# Patient Record
Sex: Female | Born: 1985 | Race: Black or African American | Hispanic: No | Marital: Married | Smoking: Never smoker
Health system: Southern US, Community
[De-identification: ages and names within clinical notes are randomized; demographics above are authoritative.]

## PROBLEM LIST (undated history)

## (undated) ENCOUNTER — Inpatient Hospital Stay (HOSPITAL_COMMUNITY): Payer: Self-pay

## (undated) DIAGNOSIS — O139 Gestational [pregnancy-induced] hypertension without significant proteinuria, unspecified trimester: Secondary | ICD-10-CM

## (undated) DIAGNOSIS — R519 Headache, unspecified: Secondary | ICD-10-CM

## (undated) DIAGNOSIS — Z8619 Personal history of other infectious and parasitic diseases: Secondary | ICD-10-CM

## (undated) DIAGNOSIS — R51 Headache: Secondary | ICD-10-CM

## (undated) DIAGNOSIS — R12 Heartburn: Secondary | ICD-10-CM

## (undated) DIAGNOSIS — R002 Palpitations: Secondary | ICD-10-CM

## (undated) DIAGNOSIS — D241 Benign neoplasm of right breast: Secondary | ICD-10-CM

## (undated) HISTORY — DX: Gestational (pregnancy-induced) hypertension without significant proteinuria, unspecified trimester: O13.9

## (undated) HISTORY — DX: Headache: R51

## (undated) HISTORY — DX: Personal history of other infectious and parasitic diseases: Z86.19

## (undated) HISTORY — DX: Headache, unspecified: R51.9

## (undated) HISTORY — DX: Palpitations: R00.2

---

## 2008-01-23 ENCOUNTER — Emergency Department (HOSPITAL_COMMUNITY): Admission: EM | Admit: 2008-01-23 | Discharge: 2008-01-23 | Payer: Self-pay | Admitting: Emergency Medicine

## 2011-07-16 LAB — POCT I-STAT, CHEM 8
Chloride: 104
Glucose, Bld: 117 — ABNORMAL HIGH
HCT: 45
Potassium: 3.6

## 2011-07-16 LAB — CBC
MCHC: 33.8
RBC: 4.86
WBC: 12.8 — ABNORMAL HIGH

## 2011-07-16 LAB — DIFFERENTIAL
Basophils Relative: 0
Lymphocytes Relative: 16
Monocytes Relative: 2 — ABNORMAL LOW
Neutro Abs: 10.4 — ABNORMAL HIGH
Neutrophils Relative %: 82 — ABNORMAL HIGH

## 2011-07-16 LAB — URINALYSIS, ROUTINE W REFLEX MICROSCOPIC
Ketones, ur: 40 — AB
Nitrite: NEGATIVE
Specific Gravity, Urine: 1.014
pH: 7.5

## 2011-07-16 LAB — ETHANOL: Alcohol, Ethyl (B): <5

## 2011-07-16 LAB — LIPASE, BLOOD: Lipase: 24

## 2011-10-22 NOTE — L&D Delivery Note (Addendum)
Delivery Note At 12:08 AM a viable and healthy female was delivered spontaneously, LOA.  APGAR: 8,9; weight 7 lbs 2 oz.   Placenta status: Intact, Spontaneous.  3 vessel cord.  Anesthesia:  Epidural Episiotomy: None Lacerations: None Est. Blood Loss (mL): 400  Mom to postpartum.  Baby to nursery-stable.  Orest Dygert D 08/25/2012, 12:22 AM

## 2012-03-25 LAB — OB RESULTS CONSOLE GC/CHLAMYDIA
Chlamydia: NEGATIVE
Gonorrhea: NEGATIVE

## 2012-03-25 LAB — OB RESULTS CONSOLE RUBELLA ANTIBODY, IGM: Rubella: IMMUNE

## 2012-03-25 LAB — OB RESULTS CONSOLE ANTIBODY SCREEN: Antibody Screen: NEGATIVE

## 2012-05-20 ENCOUNTER — Encounter (HOSPITAL_COMMUNITY): Payer: Self-pay | Admitting: *Deleted

## 2012-05-20 ENCOUNTER — Inpatient Hospital Stay (HOSPITAL_COMMUNITY)
Admission: AD | Admit: 2012-05-20 | Discharge: 2012-05-20 | Disposition: A | Discharge status: Home / Self Care | Payer: Medicaid Other | Source: Ambulatory Visit | Attending: Obstetrics and Gynecology | Admitting: Obstetrics and Gynecology

## 2012-05-20 DIAGNOSIS — Z711 Person with feared health complaint in whom no diagnosis is made: Secondary | ICD-10-CM

## 2012-05-20 DIAGNOSIS — O4410 Placenta previa with hemorrhage, unspecified trimester: Secondary | ICD-10-CM

## 2012-05-20 DIAGNOSIS — O368190 Decreased fetal movements, unspecified trimester, not applicable or unspecified: Secondary | ICD-10-CM | POA: Insufficient documentation

## 2012-05-20 DIAGNOSIS — O4400 Placenta previa specified as without hemorrhage, unspecified trimester: Secondary | ICD-10-CM

## 2012-05-20 NOTE — MAU Provider Note (Signed)
  History     CSN: [id number]  Arrival date and time: 05/20/12 1437   First Provider Initiated Contact with Patient 05/20/12 1536      Chief Complaint  Patient presents with  . Decreased Fetal Movement   HPI This is 26 yo G1P0 at 27.1 weeks with known placenta previa.  She felt a cramp earlier today and was worried about the baby and presented to the hospital.  She denies vaginal bleeding, discharge, decreased fetal activity.  OB History    Grav Para Term Preterm Abortions TAB SAB Ect Mult Living   1               No past medical history on file.  No past surgical history on file.  No family history on file.  History  Substance Use Topics  . Smoking status: Not on file  . Smokeless tobacco: Not on file  . Alcohol Use: Not on file    Allergies:  Allergies  Allergen Reactions  . Codeine Anaphylaxis  . Tylenol (Acetaminophen)     Nose bleed    Prescriptions prior to admission  Medication Sig Dispense Refill  . dextromethorphan-guaiFENesin (MUCINEX DM) 30-600 MG per 12 hr tablet Take 2 tablets by mouth every 12 (twelve) hours. headcold      . guaifenesin (ROBITUSSIN) 100 MG/5ML syrup Take 200 mg by mouth 3 (three) times daily as needed. headcold      . Prenatal Vit-Fe Fumarate-FA (PRENATAL MULTIVITAMIN) TABS Take 1 tablet by mouth daily.        ROS Physical Exam   Blood pressure 125/75, pulse 84, temperature 97.1 F (36.2 C), temperature source Oral, resp. rate 18, height 5' 4.5" (1.638 m), weight 77.293 kg (170 lb 6.4 oz).  Physical Exam  Constitutional: She is oriented to person, place, and time. She appears well-developed and well-nourished.  GI: Soft. She exhibits no distension and no mass. There is no tenderness. There is no rebound and no guarding.  Neurological: She is alert and oriented to person, place, and time.  Skin: Skin is warm and dry.  Psychiatric: She has a normal mood and affect. Her behavior is normal. Judgment and thought content normal.     MAU Course  Procedures NST: baseline 150.  Moderate variability.  No decels.  Appropriate for gestational age.  MDM No evidence of contractions or bleeding.  Assessment and Plan  1.  Placenta previa 2.  Worried well  Patient reassured.  D/c to home.  F/u Monday.   STINSON, JACOB JEHIEL 05/20/2012, 3:38 PM

## 2012-05-20 NOTE — MAU Note (Signed)
Pt presents with cramping x 1 hour earlier today about 1300. Pt with placenta previa and decreased fetal movement today.

## 2012-07-13 ENCOUNTER — Other Ambulatory Visit: Payer: Self-pay | Admitting: Obstetrics and Gynecology

## 2012-07-13 ENCOUNTER — Ambulatory Visit
Admission: RE | Admit: 2012-07-13 | Discharge: 2012-07-13 | Disposition: A | Discharge status: Home / Self Care | Payer: Medicaid Other | Source: Ambulatory Visit | Attending: Obstetrics and Gynecology | Admitting: Obstetrics and Gynecology

## 2012-07-13 DIAGNOSIS — N63 Unspecified lump in unspecified breast: Secondary | ICD-10-CM

## 2012-08-11 ENCOUNTER — Telehealth (HOSPITAL_COMMUNITY): Payer: Self-pay | Admitting: *Deleted

## 2012-08-11 ENCOUNTER — Encounter (HOSPITAL_COMMUNITY): Payer: Self-pay | Admitting: *Deleted

## 2012-08-11 NOTE — Telephone Encounter (Signed)
Preadmission screen  

## 2012-08-24 ENCOUNTER — Encounter (HOSPITAL_COMMUNITY): Payer: Self-pay

## 2012-08-24 ENCOUNTER — Encounter (HOSPITAL_COMMUNITY): Payer: Self-pay | Admitting: Anesthesiology

## 2012-08-24 ENCOUNTER — Inpatient Hospital Stay (HOSPITAL_COMMUNITY)
Admission: RE | Admit: 2012-08-24 | Discharge: 2012-08-26 | DRG: 775 | Disposition: A | Discharge status: Home / Self Care | Payer: 59 | Source: Ambulatory Visit | Attending: Obstetrics & Gynecology | Admitting: Obstetrics & Gynecology

## 2012-08-24 ENCOUNTER — Inpatient Hospital Stay (HOSPITAL_COMMUNITY): Payer: 59 | Admitting: Anesthesiology

## 2012-08-24 DIAGNOSIS — Z22330 Carrier of Group B streptococcus: Secondary | ICD-10-CM

## 2012-08-24 DIAGNOSIS — O480 Post-term pregnancy: Principal | ICD-10-CM | POA: Diagnosis present

## 2012-08-24 DIAGNOSIS — O99892 Other specified diseases and conditions complicating childbirth: Secondary | ICD-10-CM | POA: Diagnosis present

## 2012-08-24 LAB — CBC
Hemoglobin: 12.6 g/dL (ref 12.0–15.0)
MCH: 29.6 pg (ref 26.0–34.0)
MCHC: 34.7 g/dL (ref 30.0–36.0)
RDW: 14 % (ref 11.5–15.5)

## 2012-08-24 LAB — RPR: RPR Ser Ql: NONREACTIVE

## 2012-08-24 MED ORDER — BUTORPHANOL TARTRATE 1 MG/ML IJ SOLN: 1.0000 mg | INTRAMUSCULAR | Status: DC | PRN

## 2012-08-24 MED ORDER — LIDOCAINE HCL (PF) 1 % IJ SOLN
30.0000 mL | INTRAMUSCULAR | Status: DC | PRN
  Filled 2012-08-24: qty 30

## 2012-08-24 MED ORDER — DEXTROSE 5 % IV SOLN
2.5000 10*6.[IU] | INTRAVENOUS | Status: DC
  Administered 2012-08-24 (×2): 2.5 10*6.[IU] via INTRAVENOUS
  Filled 2012-08-24 (×8): qty 2.5

## 2012-08-24 MED ORDER — EPHEDRINE 5 MG/ML INJ
10.0000 mg | INTRAVENOUS | Status: DC | PRN
  Filled 2012-08-24: qty 4

## 2012-08-24 MED ORDER — DIPHENHYDRAMINE HCL 50 MG/ML IJ SOLN: 12.5000 mg | INTRAMUSCULAR | Status: DC | PRN

## 2012-08-24 MED ORDER — PENICILLIN G POTASSIUM 5000000 UNITS IJ SOLR
5.0000 10*6.[IU] | Freq: Once | INTRAVENOUS | Status: AC
  Administered 2012-08-24: 5 10*6.[IU] via INTRAVENOUS
  Filled 2012-08-24: qty 5

## 2012-08-24 MED ORDER — CITRIC ACID-SODIUM CITRATE 334-500 MG/5ML PO SOLN: 30.0000 mL | ORAL | Status: DC | PRN

## 2012-08-24 MED ORDER — ACETAMINOPHEN 325 MG PO TABS: 650.0000 mg | ORAL_TABLET | ORAL | Status: DC | PRN

## 2012-08-24 MED ORDER — ONDANSETRON HCL 4 MG/2ML IJ SOLN: 4.0000 mg | Freq: Four times a day (QID) | INTRAMUSCULAR | Status: DC | PRN

## 2012-08-24 MED ORDER — EPHEDRINE 5 MG/ML INJ: 10.0000 mg | INTRAVENOUS | Status: DC | PRN

## 2012-08-24 MED ORDER — PHENYLEPHRINE 40 MCG/ML (10ML) SYRINGE FOR IV PUSH (FOR BLOOD PRESSURE SUPPORT): 80.0000 ug | PREFILLED_SYRINGE | INTRAVENOUS | Status: DC | PRN

## 2012-08-24 MED ORDER — LACTATED RINGERS IV SOLN: 500.0000 mL | Freq: Once | INTRAVENOUS | Status: DC

## 2012-08-24 MED ORDER — FENTANYL 2.5 MCG/ML BUPIVACAINE 1/10 % EPIDURAL INFUSION (WH - ANES)
14.0000 mL/h | INTRAMUSCULAR | Status: DC
Start: 2012-08-24 — End: 2012-08-25
  Administered 2012-08-24 (×2): 14 mL/h via EPIDURAL
  Filled 2012-08-24 (×2): qty 125

## 2012-08-24 MED ORDER — LIDOCAINE HCL (PF) 1 % IJ SOLN
INTRAMUSCULAR | Status: DC | PRN
  Administered 2012-08-24 (×4): 4 mL

## 2012-08-24 MED ORDER — OXYTOCIN 40 UNITS IN LACTATED RINGERS INFUSION - SIMPLE MED: 62.5000 mL/h | INTRAVENOUS | Status: DC

## 2012-08-24 MED ORDER — LACTATED RINGERS IV SOLN
500.0000 mL | INTRAVENOUS | Status: DC | PRN
  Administered 2012-08-24: 800 mL via INTRAVENOUS

## 2012-08-24 MED ORDER — OXYTOCIN 40 UNITS IN LACTATED RINGERS INFUSION - SIMPLE MED
1.0000 m[IU]/min | INTRAVENOUS | Status: DC
  Administered 2012-08-24: 2 m[IU]/min via INTRAVENOUS
  Filled 2012-08-24: qty 1000

## 2012-08-24 MED ORDER — OXYCODONE-ACETAMINOPHEN 5-325 MG PO TABS: 1.0000 | ORAL_TABLET | ORAL | Status: DC | PRN

## 2012-08-24 MED ORDER — IBUPROFEN 600 MG PO TABS
600.0000 mg | ORAL_TABLET | Freq: Four times a day (QID) | ORAL | Status: DC | PRN
  Filled 2012-08-24: qty 1

## 2012-08-24 MED ORDER — LACTATED RINGERS IV SOLN
INTRAVENOUS | Status: DC
  Administered 2012-08-24 (×2): 1000 mL via INTRAVENOUS

## 2012-08-24 MED ORDER — PHENYLEPHRINE 40 MCG/ML (10ML) SYRINGE FOR IV PUSH (FOR BLOOD PRESSURE SUPPORT)
80.0000 ug | PREFILLED_SYRINGE | INTRAVENOUS | Status: DC | PRN
  Filled 2012-08-24: qty 5

## 2012-08-24 MED ORDER — TERBUTALINE SULFATE 1 MG/ML IJ SOLN: 0.2500 mg | Freq: Once | INTRAMUSCULAR | Status: AC | PRN

## 2012-08-24 MED ORDER — OXYTOCIN BOLUS FROM INFUSION: 500.0000 mL | INTRAVENOUS | Status: DC

## 2012-08-24 NOTE — Anesthesia Procedure Notes (Signed)
Epidural Patient location during procedure: OB Start time: 08/24/2012 2:05 PM  Staffing Performed by: anesthesiologist   Preanesthetic Checklist Completed: patient identified, site marked, surgical consent, pre-op evaluation, timeout performed, IV checked, risks and benefits discussed and monitors and equipment checked  Epidural Patient position: sitting Prep: site prepped and draped and DuraPrep Patient monitoring: continuous pulse ox and blood pressure Approach: midline Injection technique: LOR air  Needle:  Needle type: Tuohy  Needle gauge: 17 G Needle length: 9 cm and 9 Needle insertion depth: 6 cm Catheter type: closed end flexible Catheter size: 19 Gauge Catheter at skin depth: 11 cm Test dose: negative  Assessment Events: blood not aspirated, injection not painful, no injection resistance, negative IV test and no paresthesia  Additional Notes Discussed risk of headache, infection, bleeding, nerve injury and failed or incomplete block.  Patient voices understanding and wishes to proceed. Reason for block:procedure for pain

## 2012-08-24 NOTE — Anesthesia Preprocedure Evaluation (Signed)

## 2012-08-24 NOTE — H&P (Signed)
26 y.o. G1P0  Estimated Date of Delivery: 08/18/12 admitted at 40/[redacted] weeks gestation for induction.  Prenatal Transfer Tool  Maternal Diabetes: No Genetic Screening: Normal Maternal Ultrasounds/Referrals: Normal Fetal Ultrasounds or other Referrals:  None Maternal Substance Abuse:  No Significant Maternal Medications:  None Significant Maternal Lab Results: Lab values include: Group B Strep positive Other Significant Pregnancy Complications:  Post dates.  Afebrile, VSS Heart and Lungs: No active disease Abdomen: soft, gravid, EFW AGA. Cervical exam:  2/50.  Impression: post date pregnancy  Plan:  Induction of labor.

## 2012-08-24 NOTE — Progress Notes (Signed)
MD notified of pt status, SVE, FHR baseline increase and decrease in FHR variability. MD reviewed FHR tracing. Will continue to monitor.

## 2012-08-25 ENCOUNTER — Encounter (HOSPITAL_COMMUNITY): Payer: Self-pay

## 2012-08-25 LAB — CBC
Platelets: 168 10*3/uL (ref 150–400)
RBC: 4.13 MIL/uL (ref 3.87–5.11)
WBC: 19.6 10*3/uL — ABNORMAL HIGH (ref 4.0–10.5)

## 2012-08-25 MED ORDER — WITCH HAZEL-GLYCERIN EX PADS: 1.0000 "application " | MEDICATED_PAD | CUTANEOUS | Status: DC | PRN

## 2012-08-25 MED ORDER — SIMETHICONE 80 MG PO CHEW
80.0000 mg | CHEWABLE_TABLET | ORAL | Status: DC | PRN
  Administered 2012-08-25 (×2): 80 mg via ORAL

## 2012-08-25 MED ORDER — TRAMADOL HCL 50 MG PO TABS
50.0000 mg | ORAL_TABLET | Freq: Four times a day (QID) | ORAL | Status: DC
  Administered 2012-08-25 – 2012-08-26 (×3): 50 mg via ORAL
  Filled 2012-08-25 (×3): qty 1

## 2012-08-25 MED ORDER — ONDANSETRON HCL 4 MG PO TABS: 4.0000 mg | ORAL_TABLET | ORAL | Status: DC | PRN

## 2012-08-25 MED ORDER — TETANUS-DIPHTH-ACELL PERTUSSIS 5-2.5-18.5 LF-MCG/0.5 IM SUSP: 0.5000 mL | Freq: Once | INTRAMUSCULAR | Status: DC

## 2012-08-25 MED ORDER — BENZOCAINE-MENTHOL 20-0.5 % EX AERO
1.0000 "application " | INHALATION_SPRAY | CUTANEOUS | Status: DC | PRN
  Filled 2012-08-25: qty 56

## 2012-08-25 MED ORDER — SENNOSIDES-DOCUSATE SODIUM 8.6-50 MG PO TABS
2.0000 | ORAL_TABLET | Freq: Every day | ORAL | Status: DC
  Administered 2012-08-25: 2 via ORAL

## 2012-08-25 MED ORDER — LANOLIN HYDROUS EX OINT: TOPICAL_OINTMENT | CUTANEOUS | Status: DC | PRN

## 2012-08-25 MED ORDER — PRENATAL MULTIVITAMIN CH
1.0000 | ORAL_TABLET | Freq: Every day | ORAL | Status: DC
  Administered 2012-08-25 – 2012-08-26 (×2): 1 via ORAL
  Filled 2012-08-25 (×2): qty 1

## 2012-08-25 MED ORDER — ONDANSETRON HCL 4 MG/2ML IJ SOLN: 4.0000 mg | INTRAMUSCULAR | Status: DC | PRN

## 2012-08-25 MED ORDER — IBUPROFEN 600 MG PO TABS
600.0000 mg | ORAL_TABLET | Freq: Four times a day (QID) | ORAL | Status: DC
  Administered 2012-08-25 – 2012-08-26 (×6): 600 mg via ORAL
  Filled 2012-08-25 (×6): qty 1

## 2012-08-25 MED ORDER — DIBUCAINE 1 % RE OINT: 1.0000 "application " | TOPICAL_OINTMENT | RECTAL | Status: DC | PRN

## 2012-08-25 MED ORDER — DIPHENHYDRAMINE HCL 25 MG PO CAPS: 25.0000 mg | ORAL_CAPSULE | Freq: Four times a day (QID) | ORAL | Status: DC | PRN

## 2012-08-25 MED ORDER — OXYCODONE-ACETAMINOPHEN 5-325 MG PO TABS: 1.0000 | ORAL_TABLET | ORAL | Status: DC | PRN

## 2012-08-25 MED ORDER — HYDROMORPHONE HCL 2 MG PO TABS: 1.0000 mg | ORAL_TABLET | ORAL | Status: DC | PRN

## 2012-08-25 MED ORDER — ZOLPIDEM TARTRATE 5 MG PO TABS: 5.0000 mg | ORAL_TABLET | Freq: Every evening | ORAL | Status: DC | PRN

## 2012-08-26 MED ORDER — IBUPROFEN 600 MG PO TABS: 600.0000 mg | ORAL_TABLET | Freq: Four times a day (QID) | ORAL | Status: DC

## 2012-08-26 NOTE — Discharge Summary (Signed)
Obstetric Discharge Summary Reason for Admission: induction of labor Prenatal Procedures: none Intrapartum Procedures: spontaneous vaginal delivery Postpartum Procedures: antibiotics Complications-Operative and Postpartum: none Hemoglobin  Date Value Range Status  08/25/2012 11.9* 12.0 - 15.0 g/dL Final     HCT  Date Value Range Status  08/25/2012 35.4* 36.0 - 46.0 % Final    Physical Exam:  General: alert and cooperative Lochia: appropriate Uterine Fundus: firm DVT Evaluation: No evidence of DVT seen on physical exam. Negative Homan's sign.  Discharge Diagnoses: Term Pregnancy-delivered  Discharge Information: Date: 08/26/2012 Activity: pelvic rest Diet: routine Medications: PNV, Ibuprofen and Colace Condition: stable Instructions: refer to practice specific booklet Discharge to: home Follow-up Information    Follow up with Tignall Paone, DO. In 4 weeks.   Contact information:   [address]    [place] [address] [phone]          Newborn Data: Live born female  Birth Weight: 7 lb 1.6 oz (3220 g) APGAR: 7, 9  Home with mother.  Anna Santos 08/26/2012, 11:35 AM

## 2012-08-28 NOTE — Anesthesia Postprocedure Evaluation (Signed)
Patient stable following vaginal delivery.  

## 2014-08-22 ENCOUNTER — Encounter (HOSPITAL_COMMUNITY): Payer: Self-pay

## 2015-10-22 NOTE — L&D Delivery Note (Signed)
Delivery Note At 12:41 AM a viable and healthy female was delivered via Vaginal, Spontaneous Delivery (Presentation: LOA; vtx ).  APGAR: 8, 9; weight pending .   Placenta status: spontaneous, intact not sent, .  Cord:  with the following complications: none.  Cord pH: none  Anesthesia:   Episiotomy: None Lacerations: 2nd perineal degree Suture Repair: 3.0 chromic Est. Blood Loss (mL): 250  Mom to postpartum.  Baby to Couplet care / Skin to Skin.  Darrie Macmillan A 06/23/2016, 1:44 AM

## 2016-03-14 LAB — OB RESULTS CONSOLE ABO/RH: RH TYPE: POSITIVE

## 2016-03-14 LAB — OB RESULTS CONSOLE HEPATITIS B SURFACE ANTIGEN: HEP B S AG: NEGATIVE

## 2016-03-14 LAB — OB RESULTS CONSOLE HIV ANTIBODY (ROUTINE TESTING): HIV: NONREACTIVE

## 2016-03-14 LAB — OB RESULTS CONSOLE GC/CHLAMYDIA
CHLAMYDIA, DNA PROBE: NEGATIVE
Gonorrhea: NEGATIVE

## 2016-03-14 LAB — OB RESULTS CONSOLE RPR: RPR: NONREACTIVE

## 2016-03-14 LAB — OB RESULTS CONSOLE RUBELLA ANTIBODY, IGM: RUBELLA: IMMUNE

## 2016-03-14 LAB — OB RESULTS CONSOLE ANTIBODY SCREEN: ANTIBODY SCREEN: NEGATIVE

## 2016-03-26 ENCOUNTER — Other Ambulatory Visit: Payer: Self-pay | Admitting: Obstetrics and Gynecology

## 2016-03-26 DIAGNOSIS — N63 Unspecified lump in unspecified breast: Secondary | ICD-10-CM

## 2016-04-04 ENCOUNTER — Ambulatory Visit
Admission: RE | Admit: 2016-04-04 | Discharge: 2016-04-04 | Disposition: A | Discharge status: Home / Self Care | Payer: 59 | Source: Ambulatory Visit | Attending: Obstetrics and Gynecology | Admitting: Obstetrics and Gynecology

## 2016-04-04 ENCOUNTER — Other Ambulatory Visit: Payer: Self-pay | Admitting: Obstetrics and Gynecology

## 2016-04-04 DIAGNOSIS — N63 Unspecified lump in unspecified breast: Secondary | ICD-10-CM

## 2016-04-04 DIAGNOSIS — N6310 Unspecified lump in the right breast, unspecified quadrant: Secondary | ICD-10-CM

## 2016-04-09 ENCOUNTER — Ambulatory Visit
Admission: RE | Admit: 2016-04-09 | Discharge: 2016-04-09 | Disposition: A | Discharge status: Home / Self Care | Payer: 59 | Source: Ambulatory Visit | Attending: Obstetrics and Gynecology | Admitting: Obstetrics and Gynecology

## 2016-04-09 DIAGNOSIS — N6310 Unspecified lump in the right breast, unspecified quadrant: Secondary | ICD-10-CM

## 2016-05-20 LAB — OB RESULTS CONSOLE GBS: STREP GROUP B AG: NEGATIVE

## 2016-06-17 ENCOUNTER — Inpatient Hospital Stay (HOSPITAL_COMMUNITY)
Admission: AD | Admit: 2016-06-17 | Discharge: 2016-06-17 | Disposition: A | Discharge status: Home / Self Care | Payer: 59 | Source: Ambulatory Visit | Attending: Obstetrics and Gynecology | Admitting: Obstetrics and Gynecology

## 2016-06-17 ENCOUNTER — Other Ambulatory Visit: Payer: Self-pay | Admitting: Obstetrics and Gynecology

## 2016-06-17 ENCOUNTER — Encounter (HOSPITAL_COMMUNITY): Payer: Self-pay | Admitting: *Deleted

## 2016-06-17 DIAGNOSIS — O133 Gestational [pregnancy-induced] hypertension without significant proteinuria, third trimester: Secondary | ICD-10-CM | POA: Diagnosis present

## 2016-06-17 DIAGNOSIS — Z3A39 39 weeks gestation of pregnancy: Secondary | ICD-10-CM | POA: Insufficient documentation

## 2016-06-17 HISTORY — DX: Benign neoplasm of right breast: D24.1

## 2016-06-17 HISTORY — DX: Heartburn: R12

## 2016-06-17 LAB — COMPREHENSIVE METABOLIC PANEL
ALT: 10 U/L — AB (ref 14–54)
AST: 18 U/L (ref 15–41)
Albumin: 3 g/dL — ABNORMAL LOW (ref 3.5–5.0)
Alkaline Phosphatase: 117 U/L (ref 38–126)
Anion gap: 5 (ref 5–15)
BUN: 10 mg/dL (ref 6–20)
CHLORIDE: 106 mmol/L (ref 101–111)
CO2: 23 mmol/L (ref 22–32)
CREATININE: 0.59 mg/dL (ref 0.44–1.00)
Calcium: 8.9 mg/dL (ref 8.9–10.3)
Glucose, Bld: 78 mg/dL (ref 65–99)
POTASSIUM: 4.2 mmol/L (ref 3.5–5.1)
Sodium: 134 mmol/L — ABNORMAL LOW (ref 135–145)
Total Bilirubin: 0.5 mg/dL (ref 0.3–1.2)
Total Protein: 6.4 g/dL — ABNORMAL LOW (ref 6.5–8.1)

## 2016-06-17 LAB — CBC
HCT: 35.8 % — ABNORMAL LOW (ref 36.0–46.0)
Hemoglobin: 12.1 g/dL (ref 12.0–15.0)
MCH: 27.1 pg (ref 26.0–34.0)
MCHC: 33.8 g/dL (ref 30.0–36.0)
MCV: 80.1 fL (ref 78.0–100.0)
PLATELETS: 208 10*3/uL (ref 150–400)
RBC: 4.47 MIL/uL (ref 3.87–5.11)
RDW: 13.8 % (ref 11.5–15.5)
WBC: 10.7 10*3/uL — ABNORMAL HIGH (ref 4.0–10.5)

## 2016-06-17 LAB — PROTEIN / CREATININE RATIO, URINE
CREATININE, URINE: 142 mg/dL
Protein Creatinine Ratio: 0.09 mg/mg{Cre} (ref 0.00–0.15)
Total Protein, Urine: 13 mg/dL

## 2016-06-17 LAB — URIC ACID: URIC ACID, SERUM: 6.1 mg/dL (ref 2.3–6.6)

## 2016-06-17 NOTE — MAU Provider Note (Signed)
History     Chief Complaint  Patient presents with  . Hypertension  30 yo [id number] BF @ 39 1/[redacted] weeks gestation sent from  The office for evaluation of elevated BP ( 140/90) in the office. Pt had trace protein. Denies h/a, visual changes or epigastric pain  OB History    Gravida Para Term Preterm AB Living   3 1 1   1 1    SAB TAB Ectopic Multiple Live Births   1       1      Past Medical History:  Diagnosis Date  . Fibroadenoma of right breast   . H/O varicella   . Heartburn     History reviewed. No pertinent surgical history.  Family History  Problem Relation Age of Onset  . Cancer Mother     cervical  . Seizures Mother   . Diabetes Father   . Other Other     extra digits    Social History  Substance Use Topics  . Smoking status: Never Smoker  . Smokeless tobacco: Never Used  . Alcohol use No    Allergies:  Allergies  Allergen Reactions  . Codeine Anaphylaxis    No reactions to tramadol, pt mom has the codeine allg  . Banana Itching  . Tylenol [Acetaminophen]     Nose bleed    Prescriptions Prior to Admission  Medication Sig Dispense Refill Last Dose  . Prenatal Vit-Fe Fumarate-FA (PRENATAL MULTIVITAMIN) TABS Take 1 tablet by mouth at bedtime.    06/16/2016 at Unknown time     Physical Exam   Blood pressure 131/81, pulse 84, temperature 98.9 F (37.2 C), temperature source Oral, resp. rate 18, height 5\' 5"  (1.651 m), weight 93.4 kg (206 lb), unknown if currently breastfeeding. No data found.  No exam performed today, exam done in office.  IMP: Gestational HTN Term gestation P) PIH labs, serial BP, NST ED Course   MDM  Rekita Miotke A, MD 12:20 PM 06/17/2016 Addendum:  NST reactive baseline 150 (+) accel to 165 PIH labs nl: protein/creatinine ratio 0.09 hct 35.8 plt 208K  AST 18, ALT 10  Uric acid 6.1 D/c home. OOW. F/u office. Preeclampsia warning signs

## 2016-06-17 NOTE — MAU Note (Signed)
BP elevated in office today

## 2016-06-17 NOTE — Discharge Instructions (Signed)
Keep your scheduled appt for prenatal care. Call the office or provider on call with further concerns.

## 2016-06-18 ENCOUNTER — Telehealth (HOSPITAL_COMMUNITY): Payer: Self-pay | Admitting: *Deleted

## 2016-06-18 ENCOUNTER — Encounter (HOSPITAL_COMMUNITY): Payer: Self-pay | Admitting: *Deleted

## 2016-06-18 NOTE — Telephone Encounter (Signed)
Preadmission screen  

## 2016-06-22 ENCOUNTER — Inpatient Hospital Stay (HOSPITAL_COMMUNITY): Payer: 59 | Admitting: Anesthesiology

## 2016-06-22 ENCOUNTER — Inpatient Hospital Stay (HOSPITAL_COMMUNITY): Admission: RE | Admit: 2016-06-22 | Payer: 59 | Source: Ambulatory Visit

## 2016-06-22 ENCOUNTER — Encounter (HOSPITAL_COMMUNITY): Payer: Self-pay | Admitting: *Deleted

## 2016-06-22 ENCOUNTER — Inpatient Hospital Stay (HOSPITAL_COMMUNITY)
Admission: AD | Admit: 2016-06-22 | Discharge: 2016-06-24 | DRG: 775 | Disposition: A | Discharge status: Home / Self Care | Payer: 59 | Source: Ambulatory Visit | Attending: Obstetrics and Gynecology | Admitting: Obstetrics and Gynecology

## 2016-06-22 DIAGNOSIS — Z3A40 40 weeks gestation of pregnancy: Secondary | ICD-10-CM

## 2016-06-22 DIAGNOSIS — Z8249 Family history of ischemic heart disease and other diseases of the circulatory system: Secondary | ICD-10-CM | POA: Diagnosis not present

## 2016-06-22 DIAGNOSIS — O2243 Hemorrhoids in pregnancy, third trimester: Secondary | ICD-10-CM | POA: Diagnosis present

## 2016-06-22 DIAGNOSIS — Z833 Family history of diabetes mellitus: Secondary | ICD-10-CM

## 2016-06-22 DIAGNOSIS — Z641 Problems related to multiparity: Secondary | ICD-10-CM

## 2016-06-22 DIAGNOSIS — Z3483 Encounter for supervision of other normal pregnancy, third trimester: Secondary | ICD-10-CM | POA: Diagnosis present

## 2016-06-22 LAB — CBC
HCT: 36 % (ref 36.0–46.0)
HCT: 36.6 % (ref 36.0–46.0)
HEMOGLOBIN: 12.3 g/dL (ref 12.0–15.0)
Hemoglobin: 12.2 g/dL (ref 12.0–15.0)
MCH: 27.3 pg (ref 26.0–34.0)
MCH: 27.2 pg (ref 26.0–34.0)
MCHC: 33.9 g/dL (ref 30.0–36.0)
MCHC: 33.6 g/dL (ref 30.0–36.0)
MCV: 80.5 fL (ref 78.0–100.0)
MCV: 80.8 fL (ref 78.0–100.0)
PLATELETS: 210 10*3/uL (ref 150–400)
Platelets: 231 10*3/uL (ref 150–400)
RBC: 4.47 MIL/uL (ref 3.87–5.11)
RBC: 4.53 MIL/uL (ref 3.87–5.11)
RDW: 13.9 % (ref 11.5–15.5)
RDW: 13.9 % (ref 11.5–15.5)
WBC: 10 10*3/uL (ref 4.0–10.5)
WBC: 13.3 10*3/uL — ABNORMAL HIGH (ref 4.0–10.5)

## 2016-06-22 LAB — TYPE AND SCREEN
ABO/RH(D): A POS
Antibody Screen: NEGATIVE

## 2016-06-22 LAB — RPR: RPR: NONREACTIVE

## 2016-06-22 MED ORDER — EPHEDRINE 5 MG/ML INJ
10.0000 mg | INTRAVENOUS | Status: DC | PRN
  Filled 2016-06-22: qty 4

## 2016-06-22 MED ORDER — OXYTOCIN 40 UNITS IN LACTATED RINGERS INFUSION - SIMPLE MED
2.5000 [IU]/h | INTRAVENOUS | Status: DC
  Filled 2016-06-22: qty 1000

## 2016-06-22 MED ORDER — BUTORPHANOL TARTRATE 2 MG/ML IJ SOLN
2.0000 mg | INTRAMUSCULAR | Status: DC | PRN
  Administered 2016-06-22: 2 mg via INTRAVENOUS
  Filled 2016-06-22: qty 2

## 2016-06-22 MED ORDER — LIDOCAINE HCL (PF) 1 % IJ SOLN
30.0000 mL | INTRAMUSCULAR | Status: DC | PRN
  Administered 2016-06-23: 30 mL via SUBCUTANEOUS
  Filled 2016-06-22: qty 30

## 2016-06-22 MED ORDER — MISOPROSTOL 200 MCG PO TABS: 50.0000 ug | ORAL_TABLET | ORAL | Status: DC | PRN

## 2016-06-22 MED ORDER — LACTATED RINGERS IV SOLN: 500.0000 mL | INTRAVENOUS | Status: DC | PRN

## 2016-06-22 MED ORDER — OXYTOCIN BOLUS FROM INFUSION: 500.0000 mL | Freq: Once | INTRAVENOUS | Status: DC

## 2016-06-22 MED ORDER — PHENYLEPHRINE 40 MCG/ML (10ML) SYRINGE FOR IV PUSH (FOR BLOOD PRESSURE SUPPORT)
80.0000 ug | PREFILLED_SYRINGE | INTRAVENOUS | Status: DC | PRN
  Filled 2016-06-22: qty 10
  Filled 2016-06-22: qty 5

## 2016-06-22 MED ORDER — LACTATED RINGERS IV SOLN
INTRAVENOUS | Status: DC
  Administered 2016-06-22: 125 mL/h via INTRAVENOUS
  Administered 2016-06-22: 21:00:00 via INTRAVENOUS

## 2016-06-22 MED ORDER — LACTATED RINGERS IV SOLN: 500.0000 mL | Freq: Once | INTRAVENOUS | Status: DC

## 2016-06-22 MED ORDER — LIDOCAINE HCL (PF) 1 % IJ SOLN
INTRAMUSCULAR | Status: DC | PRN
  Administered 2016-06-22 (×2): 6 mL

## 2016-06-22 MED ORDER — MISOPROSTOL 25 MCG QUARTER TABLET
25.0000 ug | ORAL_TABLET | ORAL | Status: DC
  Administered 2016-06-22 (×2): 25 ug via VAGINAL
  Filled 2016-06-22 (×2): qty 1
  Filled 2016-06-22 (×2): qty 0.25
  Filled 2016-06-22: qty 1

## 2016-06-22 MED ORDER — FENTANYL 2.5 MCG/ML BUPIVACAINE 1/10 % EPIDURAL INFUSION (WH - ANES)
14.0000 mL/h | INTRAMUSCULAR | Status: DC | PRN
  Administered 2016-06-22: 14 mL/h via EPIDURAL
  Filled 2016-06-22: qty 125

## 2016-06-22 MED ORDER — DIPHENHYDRAMINE HCL 50 MG/ML IJ SOLN: 12.5000 mg | INTRAMUSCULAR | Status: DC | PRN

## 2016-06-22 MED ORDER — PHENYLEPHRINE 40 MCG/ML (10ML) SYRINGE FOR IV PUSH (FOR BLOOD PRESSURE SUPPORT)
80.0000 ug | PREFILLED_SYRINGE | INTRAVENOUS | Status: DC | PRN
  Filled 2016-06-22: qty 5
  Filled 2016-06-22: qty 10

## 2016-06-22 MED ORDER — TERBUTALINE SULFATE 1 MG/ML IJ SOLN
0.2500 mg | Freq: Once | INTRAMUSCULAR | Status: DC | PRN
  Filled 2016-06-22: qty 1

## 2016-06-22 MED ORDER — ONDANSETRON HCL 4 MG/2ML IJ SOLN: 4.0000 mg | Freq: Four times a day (QID) | INTRAMUSCULAR | Status: DC | PRN

## 2016-06-22 MED ORDER — SOD CITRATE-CITRIC ACID 500-334 MG/5ML PO SOLN: 30.0000 mL | ORAL | Status: DC | PRN

## 2016-06-22 MED ORDER — MISOPROSTOL 25 MCG QUARTER TABLET
25.0000 ug | ORAL_TABLET | ORAL | Status: DC | PRN
  Administered 2016-06-22 (×2): 25 ug via ORAL
  Filled 2016-06-22 (×2): qty 0.25

## 2016-06-22 MED ORDER — OXYTOCIN 10 UNIT/ML IJ SOLN: 10.0000 [IU] | Freq: Once | INTRAMUSCULAR | Status: DC

## 2016-06-22 MED ORDER — ZOLPIDEM TARTRATE 5 MG PO TABS: 5.0000 mg | ORAL_TABLET | Freq: Every evening | ORAL | Status: DC | PRN

## 2016-06-22 NOTE — H&P (Signed)
Anna Santos is a 30 y.o. female presenting for IOL at term due to multiparity. PNC notable for large right breast mass biopsy proven fibroadenoma OB History    Gravida Para Term Preterm AB Living   3 1 1   1 1    SAB TAB Ectopic Multiple Live Births   1       1     Past Medical History:  Diagnosis Date  . Fibroadenoma of right breast   . H/O varicella   . Headache   . Heartburn   . Palpitations   . Pregnancy induced hypertension    History reviewed. No pertinent surgical history. Family History: family history includes Cancer in her mother; Diabetes in her father and paternal grandmother; Heart attack in her father; Hypertension in her father; Migraines in her mother; Other in her other; Seizures in her mother. Social History:  reports that she has never smoked. She has never used smokeless tobacco. She reports that she does not drink alcohol or use drugs.     Maternal Diabetes: No Genetic Screening: Normal Maternal Ultrasounds/Referrals: Normal Fetal Ultrasounds or other Referrals:  None Maternal Substance Abuse:  No Significant Maternal Medications:  None Significant Maternal Lab Results:  Lab values include: Group B Strep negative Other Comments:  large ight breast mass bx proven fibroadenoma  Review of Systems  All other systems reviewed and are negative.  History Dilation: 1 Effacement (%): Thick Station: -3 Exam by:: s grindstaff rn Blood pressure 136/88, pulse 80, temperature 97.5 F (36.4 C), temperature source Oral, resp. rate 20, height 5\' 5"  (1.651 m), weight 93.9 kg (207 lb), unknown if currently breastfeeding. Exam Physical Exam  Constitutional: She is oriented to person, place, and time. She appears well-developed and well-nourished.  Neck: Neck supple.  Cardiovascular: Normal rate and regular rhythm.   Respiratory: Breath sounds normal.  GI: Soft.  Musculoskeletal: She exhibits edema.  Neurological: She is alert and oriented to person, place, and  time.  Skin: Skin is warm and dry.    Prenatal labs: ABO, Rh: A/Positive/-- (05/25 0000) Antibody: Negative (05/25 0000) Rubella: Immune (05/25 0000) RPR: Nonreactive (05/25 0000)  HBsAg: Negative (05/25 0000)  HIV: Non-reactive (05/25 0000)  GBS: Negative (07/31 0000)   Assessment/Plan: Term gestation Right breast mass P) admit routine labs. Epidural prn. Oral cytotec. Pitocin prn   Harlei Lehrmann A 06/22/2016, 9:15 AM

## 2016-06-22 NOTE — Anesthesia Preprocedure Evaluation (Addendum)
Anesthesia Evaluation  Patient identified by MRN, date of birth, ID band Patient awake    Reviewed: Allergy & Precautions, NPO status , Patient's Chart, lab work & pertinent test results  Airway Mallampati: II  TM Distance: >3 FB Neck ROM: Full    Dental no notable dental hx.    Pulmonary neg pulmonary ROS,    Pulmonary exam normal breath sounds clear to auscultation       Cardiovascular hypertension, Normal cardiovascular exam Rhythm:Regular Rate:Normal     Neuro/Psych  Headaches, negative psych ROS   GI/Hepatic negative GI ROS, Neg liver ROS,   Endo/Other  negative endocrine ROS  Renal/GU negative Renal ROS  negative genitourinary   Musculoskeletal negative musculoskeletal ROS (+)   Abdominal   Peds negative pediatric ROS (+)  Hematology negative hematology ROS (+)   Anesthesia Other Findings   Reproductive/Obstetrics negative OB ROS                            Anesthesia Physical Anesthesia Plan  ASA: II  Anesthesia Plan: Epidural   Post-op Pain Management:    Induction: Intravenous  Airway Management Planned: Natural Airway  Additional Equipment:   Intra-op Plan:   Post-operative Plan:   Informed Consent: I have reviewed the patients History and Physical, chart, labs and discussed the procedure including the risks, benefits and alternatives for the proposed anesthesia with the patient or authorized representative who has indicated his/her understanding and acceptance.   Dental advisory given  Plan Discussed with: CRNA  Anesthesia Plan Comments: (Informed consent obtained prior to proceeding including risk of failure, 1% risk of PDPH, risk of minor discomfort and bruising.  Discussed rare but serious complications including epidural abscess, permanent nerve injury, epidural hematoma.  Discussed alternatives to epidural analgesia and patient desires to proceed.  Timeout  performed pre-procedure verifying patient name, procedure, and platelet count.  Patient tolerated procedure well.)        Anesthesia Quick Evaluation

## 2016-06-22 NOTE — Anesthesia Pain Management Evaluation Note (Signed)
  CRNA Pain Management Visit Note  Patient: Anna Santos, 30 y.o., female  "Hello I am a member of the anesthesia team at [facility]. We have an anesthesia team available at all times to provide care throughout the hospital, including epidural management and anesthesia for C-section. I don't know your plan for the delivery whether it a natural birth, water birth, IV sedation, nitrous supplementation, doula or epidural, but we want to meet your pain goals."   1.Was your pain managed to your expectations on prior hospitalizations?     2.What is your expectation for pain management during this hospitalization?    3.How can we help you reach that goal?   Record the patient's initial score and the patient's pain goal.   Pain: 0  Pain Goal: 5 The [facility] wants you to be able to say your pain was always managed very well.  Anna Santos 06/22/2016

## 2016-06-22 NOTE — Progress Notes (Signed)
S; notes some cramping S/p oral cytotec x 2  O; VE deferred Tracing: cat 1 baseline140-145  IMP: multiparity P)change to cytotec per vagina next dose`. Pt advised

## 2016-06-22 NOTE — Anesthesia Procedure Notes (Signed)
Epidural Patient location during procedure: OB  Staffing Anesthesiologist: Jannell Franta  Preanesthetic Checklist Completed: patient identified, site marked, surgical consent, pre-op evaluation, timeout performed, IV checked, risks and benefits discussed and monitors and equipment checked  Epidural Patient position: sitting Prep: DuraPrep Patient monitoring: blood pressure and heart rate Approach: midline Location: L4-L5 Injection technique: LOR saline  Needle:  Needle type: Tuohy  Needle gauge: 17 G Needle length: 9 cm Needle insertion depth: 6 cm Catheter type: closed end flexible Catheter size: 19 Gauge Catheter at skin depth: 13 cm Test dose: negative and Other  Assessment Events: blood not aspirated, injection not painful, no injection resistance, negative IV test and no paresthesia  Additional Notes Reason for block:procedure for pain   

## 2016-06-23 ENCOUNTER — Encounter (HOSPITAL_COMMUNITY): Payer: Self-pay | Admitting: Certified Nurse Midwife

## 2016-06-23 MED ORDER — COCONUT OIL OIL
1.0000 | TOPICAL_OIL | Status: DC | PRN
Start: 2016-06-23 — End: 2016-06-24
  Administered 2016-06-23: 1 via TOPICAL
  Filled 2016-06-23: qty 120

## 2016-06-23 MED ORDER — SIMETHICONE 80 MG PO CHEW: 80.0000 mg | CHEWABLE_TABLET | ORAL | Status: DC | PRN

## 2016-06-23 MED ORDER — ONDANSETRON HCL 4 MG PO TABS: 4.0000 mg | ORAL_TABLET | ORAL | Status: DC | PRN

## 2016-06-23 MED ORDER — BENZOCAINE-MENTHOL 20-0.5 % EX AERO
1.0000 "application " | INHALATION_SPRAY | CUTANEOUS | Status: DC | PRN
  Administered 2016-06-23: 1 via TOPICAL
  Filled 2016-06-23: qty 56

## 2016-06-23 MED ORDER — IBUPROFEN 600 MG PO TABS
600.0000 mg | ORAL_TABLET | Freq: Four times a day (QID) | ORAL | Status: DC
  Administered 2016-06-23 – 2016-06-24 (×5): 600 mg via ORAL
  Filled 2016-06-23 (×5): qty 1

## 2016-06-23 MED ORDER — DIBUCAINE 1 % RE OINT: 1.0000 "application " | TOPICAL_OINTMENT | RECTAL | Status: DC | PRN

## 2016-06-23 MED ORDER — OXYCODONE HCL 5 MG PO TABS: 10.0000 mg | ORAL_TABLET | ORAL | Status: DC | PRN

## 2016-06-23 MED ORDER — WITCH HAZEL-GLYCERIN EX PADS: 1.0000 "application " | MEDICATED_PAD | CUTANEOUS | Status: DC | PRN

## 2016-06-23 MED ORDER — PRENATAL MULTIVITAMIN CH
1.0000 | ORAL_TABLET | Freq: Every day | ORAL | Status: DC
  Administered 2016-06-23: 1 via ORAL
  Filled 2016-06-23: qty 1

## 2016-06-23 MED ORDER — DIPHENHYDRAMINE HCL 25 MG PO CAPS: 25.0000 mg | ORAL_CAPSULE | Freq: Four times a day (QID) | ORAL | Status: DC | PRN

## 2016-06-23 MED ORDER — OXYCODONE HCL 5 MG PO TABS
5.0000 mg | ORAL_TABLET | ORAL | Status: DC | PRN
  Administered 2016-06-23 – 2016-06-24 (×3): 5 mg via ORAL
  Filled 2016-06-23 (×3): qty 1

## 2016-06-23 MED ORDER — FERROUS SULFATE 325 (65 FE) MG PO TABS
325.0000 mg | ORAL_TABLET | Freq: Two times a day (BID) | ORAL | Status: DC
  Administered 2016-06-23 (×2): 325 mg via ORAL
  Filled 2016-06-23 (×3): qty 1

## 2016-06-23 MED ORDER — ONDANSETRON HCL 4 MG/2ML IJ SOLN: 4.0000 mg | INTRAMUSCULAR | Status: DC | PRN

## 2016-06-23 MED ORDER — ZOLPIDEM TARTRATE 5 MG PO TABS: 5.0000 mg | ORAL_TABLET | Freq: Every evening | ORAL | Status: DC | PRN

## 2016-06-23 MED ORDER — SENNOSIDES-DOCUSATE SODIUM 8.6-50 MG PO TABS
2.0000 | ORAL_TABLET | ORAL | Status: DC
  Administered 2016-06-23: 2 via ORAL
  Filled 2016-06-23: qty 2

## 2016-06-23 NOTE — Progress Notes (Signed)
Patient admitted to [facility] from [facility] via wheelchair accompanied by RN and family. Able to ambulate from wheelchair to bed with a stesdy gait. Vitals WNL, Color is pink, LCTA Bilat, Fundus is firm and at the umbilicus with small rubra lochia. Pain scale is 0-1. Oriented to room, call light , visiting hours, and paper work. Condition  stable.

## 2016-06-23 NOTE — Lactation Note (Addendum)
This note was copied from a baby's chart. Lactation Consultation Note Initial visit at 62 hours of age.  Rn reports mom need much assistance to latch baby.  Baby is latched now on right breast in football hold.  Mom has large breasts with evert nipples and "dense" feeling breasts with tissue semi compressible.  Baby is having difficulty maintaining deep latch.  FOB instructed to help with chin tug and breast compression.  Mom is not assertive with latching baby and needs much encouragement.  Baby on and off for a while with a few audible swallows.  Baby will not maintain feeding rhythmically with intermittent sucks.  LC encouraged mom to hand express and offer drops of colostrum by spoon after feedings if baby continues to show feeding cues. LC assisted with demonstration of spoon feeding with about 68ml given and baby extends tongue well to lick off the spoon. [facility] LC resources given and discussed.  Encouraged to feed with early cues on demand.  Early newborn behavior discussed.  Hand expression demonstrated by mom with colostrum visible.  Mom to call for assist as needed.   Mom does report some initial latch on pain improved with FOB compressing breast with mom as she breasts are very large.    Patient Name: Anna Santos [id number] Date: 06/23/2016 Reason for consult: Initial assessment   Maternal Data Has patient been taught Hand Expression?: Yes Does the patient have breastfeeding experience prior to this delivery?: No  Feeding Feeding Type: Breast Fed Length of feed: 10 min  LATCH Score/Interventions Latch: Repeated attempts needed to sustain latch, nipple held in mouth throughout feeding, stimulation needed to elicit sucking reflex. Intervention(s): Adjust position;Assist with latch;Breast massage;Breast compression  Audible Swallowing: A few with stimulation Intervention(s): Skin to skin;Hand expression;Alternate breast massage  Type of Nipple: Everted at rest and after stimulation (semi  compressible)  Comfort (Breast/Nipple): Filling, red/small blisters or bruises, mild/mod discomfort  Problem noted: Mild/Moderate discomfort Interventions (Mild/moderate discomfort): Hand expression  Hold (Positioning): Assistance needed to correctly position infant at breast and maintain latch. Intervention(s): Breastfeeding basics reviewed;Position options;Support Pillows;Skin to skin  LATCH Score: 6  Lactation Tools Discussed/Used     Consult Status Consult Status: Follow-up Date: 06/24/16 Follow-up type: In-patient    Anna Santos, Anna Santos 06/23/2016, 7:22 PM

## 2016-06-23 NOTE — Progress Notes (Signed)
Patient assisted out of bed with steady gait to bathroom to void large amount in toilet. Instructed in peri care then ambulated back to bed with steady gait. Condition stable.

## 2016-06-23 NOTE — Progress Notes (Signed)
Anna Santos is a 30 y.o. G3P1011 at [redacted]w[redacted]d by LMP admitted for IOL 2nd to multiparity  Subjective: Pt  C/o rectal pressure SROM @ 11:40 Pm clear fluid Epidural  Objective: BP (!) 153/98   Pulse (!) 106   Temp 98.5 F (36.9 C) (Oral)   Resp 20   Ht 5\' 5"  (1.651 m)   Wt 93.9 kg (207 lb)   SpO2 99%   BMI 34.45 kg/m  No intake/output data recorded. Total I/O In: -  Out: 250 [Blood:250]  FHT:  FHR: 150 bpm, variability: minimal ,  accelerations:  Present,  decelerations:  Absent UC:   Ctx q 2-3 mins SVE:   10 cm dilated, 100% effaced, (+)2 station   Labs: Lab Results  Component Value Date   WBC 13.3 (H) 06/22/2016   HGB 12.3 06/22/2016   HCT 36.6 06/22/2016   MCV 80.8 06/22/2016   PLT 231 06/22/2016    Assessment / Plan: multiparity progressing rapidly after 2nd vaginal cytotec Term gestation P) start pushing  Anticipated MOD:  NSVD  Anna Santos A 06/23/2016, 1:32 AM

## 2016-06-23 NOTE — Anesthesia Postprocedure Evaluation (Signed)
Anesthesia Post Note  Patient: Anna Santos  Procedure(s) Performed: * No procedures listed *  Patient location during evaluation: Mother Baby Anesthesia Type: Epidural Level of consciousness: awake and alert Pain management: pain level controlled Vital Signs Assessment: post-procedure vital signs reviewed and stable Respiratory status: spontaneous breathing, nonlabored ventilation and respiratory function stable Cardiovascular status: stable Postop Assessment: no headache, no backache, epidural receding and patient able to bend at knees Anesthetic complications: no     Last Vitals:  Vitals:   06/23/16 0248 06/23/16 0350  BP: (!) 142/83 128/73  Pulse: 84 95  Resp: 18 20  Temp:  37.3 C    Last Pain:  Vitals:   06/23/16 0624  TempSrc:   PainSc: Asleep   Pain Goal: Patients Stated Pain Goal: 2 (06/23/16 0350)               Rayvon Char

## 2016-06-24 LAB — CBC
HCT: 37 % (ref 36.0–46.0)
Hemoglobin: 12.3 g/dL (ref 12.0–15.0)
MCH: 27 pg (ref 26.0–34.0)
MCHC: 33.2 g/dL (ref 30.0–36.0)
MCV: 81.1 fL (ref 78.0–100.0)
PLATELETS: 210 10*3/uL (ref 150–400)
RBC: 4.56 MIL/uL (ref 3.87–5.11)
RDW: 14.2 % (ref 11.5–15.5)
WBC: 12.5 10*3/uL — ABNORMAL HIGH (ref 4.0–10.5)

## 2016-06-24 MED ORDER — MAGNESIUM OXIDE -MG SUPPLEMENT 200 MG PO TABS: 1.0000 | ORAL_TABLET | Freq: Every day | ORAL | 4 refills | Status: AC

## 2016-06-24 MED ORDER — IBUPROFEN 800 MG PO TABS: 800.0000 mg | ORAL_TABLET | Freq: Four times a day (QID) | ORAL | 0 refills | Status: AC | PRN

## 2016-06-24 MED ORDER — TRAMADOL HCL 50 MG PO TABS: 50.0000 mg | ORAL_TABLET | Freq: Four times a day (QID) | ORAL | 0 refills | Status: AC | PRN

## 2016-06-24 MED ORDER — HYDROCORTISONE 2.5 % RE CREA: 1.0000 "application " | TOPICAL_CREAM | Freq: Two times a day (BID) | RECTAL | 2 refills | Status: AC

## 2016-06-24 NOTE — Discharge Summary (Signed)
Obstetric Discharge Summary Reason for Admission: induction of labor Prenatal Procedures: none Intrapartum Procedures: spontaneous vaginal delivery Postpartum Procedures: none Complications-Operative and Postpartum: 2nd degree perineal laceration Hemoglobin  Date Value Ref Range Status  06/24/2016 12.3 12.0 - 15.0 g/dL Final   HCT  Date Value Ref Range Status  06/24/2016 37.0 36.0 - 46.0 % Final    Physical Exam:  General: alert, cooperative and no distress Lochia: appropriate Uterine Fundus: firm Incision: healing well DVT Evaluation: No evidence of DVT seen on physical exam.  Discharge Diagnoses: Term Pregnancy-delivered  Discharge Information: Date: 06/24/2016 Activity: pelvic rest Diet: routine Medications: PNV, Ibuprofen and proctocream HC 2.5% / magnesium 200-400mg  daily for constipation prevention / tramadol #15 for pain Condition: stable Instructions: refer to practice specific booklet Discharge to: home Follow-up Information    COUSINS,SHERONETTE A, MD. Schedule an appointment as soon as possible for a visit in 6 week(s).   Specialty:  Obstetrics and Gynecology Contact information: [address] [place] [address] [phone]           Newborn Data: Live born female  Birth Weight: 7 lb 14.3 oz (3580 g) APGAR: 8, 9  Home with mother.  Anna Santos 06/24/2016, 9:39 AM

## 2016-06-24 NOTE — Progress Notes (Signed)
PPD 1 SVD w/ 2nd degree repair  S:  Reports feeling cramps - internal rectal pain - (hx internal hemorrhoids) - requests change in pain med & Rx cream for internal hemorrhoids             Wants DC today             Tolerating po/ No nausea or vomiting             Bleeding is light             Pain minimally controlled withmotrin but oxycodone just makes her sleepy             Up ad lib / ambulatory / voiding QS  Newborn breast feeding   O:  VS: BP 125/78   Pulse 73   Temp 97.7 F (36.5 C)   Resp 18   Ht 5\' 5"  (1.651 m)   Wt 93.9 kg (207 lb)   SpO2 100%   Breastfeeding? Unknown   BMI 34.45 kg/m   LABS:              Recent Labs  06/22/16 1942 06/24/16 0628  WBC 13.3* 12.5*  HGB 12.3 12.3  PLT 231 210               Blood type: --/--/A POS (09/02 [id number])  Rubella: Immune (05/25 0000)                      Physical Exam:             Alert and oriented X3  Abdomen: soft, non-tender, non-distended              Fundus: firm, non-tender, U-1  Perineum: mild edema  Lochia: light  Extremities: no edema, no calf pain or tenderness    A: PPD # 1   Doing well - stable status  P: Routine post partum orders - change oxycodone to Tramadol / add Procto-cream for internal hemorrhoids and magnesium daily for constipation prevention  DC home  Artelia Laroche CNM, MSN, [place] 06/24/2016, 9:08 AM

## 2016-06-24 NOTE — Lactation Note (Signed)
This note was copied from a baby's chart. Lactation Consultation Note  Patient Name: Anna Santos [id number] Date: 06/24/2016 Reason for consult: Follow-up assessment  Parents have no questions or concerns at this time.  Mom reports having nursed/lactated for 1 year w/her 1st child.   Mom has our phone # for post-d/c questions. Matthias Hughs [facility] 06/24/2016, 10:40 AM

## 2018-09-29 ENCOUNTER — Other Ambulatory Visit: Payer: Self-pay | Admitting: Obstetrics and Gynecology

## 2018-10-30 ENCOUNTER — Other Ambulatory Visit: Payer: Self-pay | Admitting: General Surgery

## 2018-11-02 ENCOUNTER — Other Ambulatory Visit: Payer: Self-pay | Admitting: General Surgery

## 2018-11-02 DIAGNOSIS — D241 Benign neoplasm of right breast: Secondary | ICD-10-CM

## 2018-11-05 ENCOUNTER — Ambulatory Visit
Admission: RE | Admit: 2018-11-05 | Discharge: 2018-11-05 | Disposition: A | Discharge status: Home / Self Care | Payer: 59 | Source: Ambulatory Visit | Attending: General Surgery | Admitting: General Surgery

## 2018-11-05 ENCOUNTER — Ambulatory Visit
Admission: RE | Admit: 2018-11-05 | Discharge: 2018-11-05 | Disposition: A | Discharge status: Home / Self Care | Payer: BC Managed Care – PPO | Source: Ambulatory Visit | Attending: General Surgery | Admitting: General Surgery

## 2018-11-05 ENCOUNTER — Other Ambulatory Visit: Payer: 59

## 2018-11-05 DIAGNOSIS — D241 Benign neoplasm of right breast: Secondary | ICD-10-CM

## 2019-11-29 ENCOUNTER — Ambulatory Visit
Admission: RE | Admit: 2019-11-29 | Discharge: 2019-11-29 | Disposition: A | Discharge status: Home / Self Care | Payer: BC Managed Care – PPO | Source: Ambulatory Visit | Attending: Internal Medicine | Admitting: Internal Medicine

## 2019-11-29 ENCOUNTER — Other Ambulatory Visit: Payer: Self-pay | Admitting: Internal Medicine

## 2019-11-29 DIAGNOSIS — M25471 Effusion, right ankle: Secondary | ICD-10-CM

## 2020-11-22 IMAGING — CR DG ANKLE COMPLETE 3+V*R*
3 series · 3 of 3 positions shown · non-contrast
Comparison: None.

CLINICAL DATA: Right ankle swelling. Twisted ankle 1 day ago, pain
and swelling laterally.

EXAM:
RIGHT ANKLE - COMPLETE 3+ VIEW

[t ankle joint ap right]
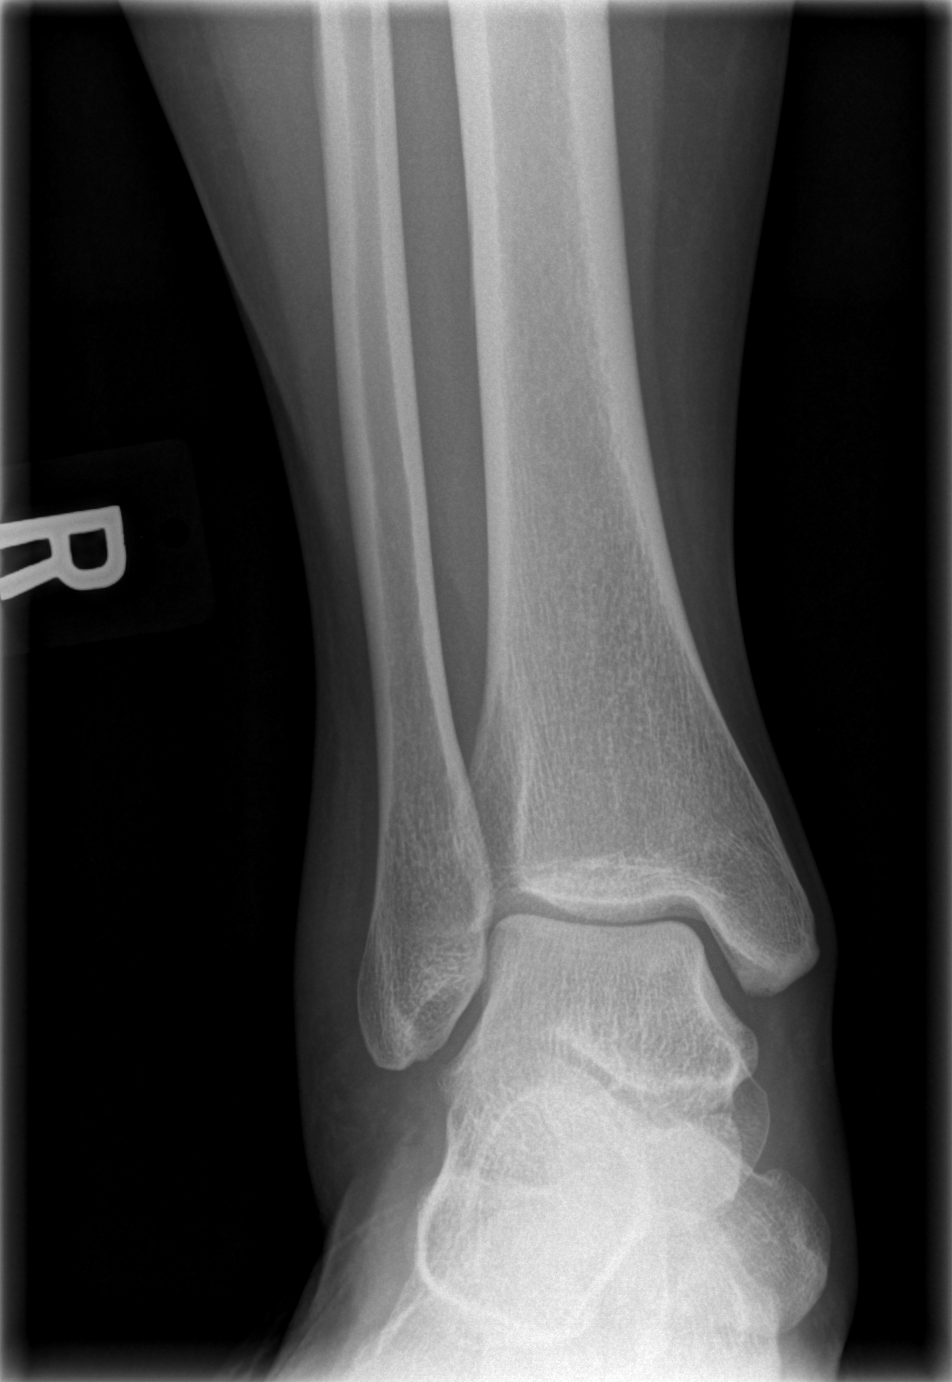

[t ankle joint oblique right]
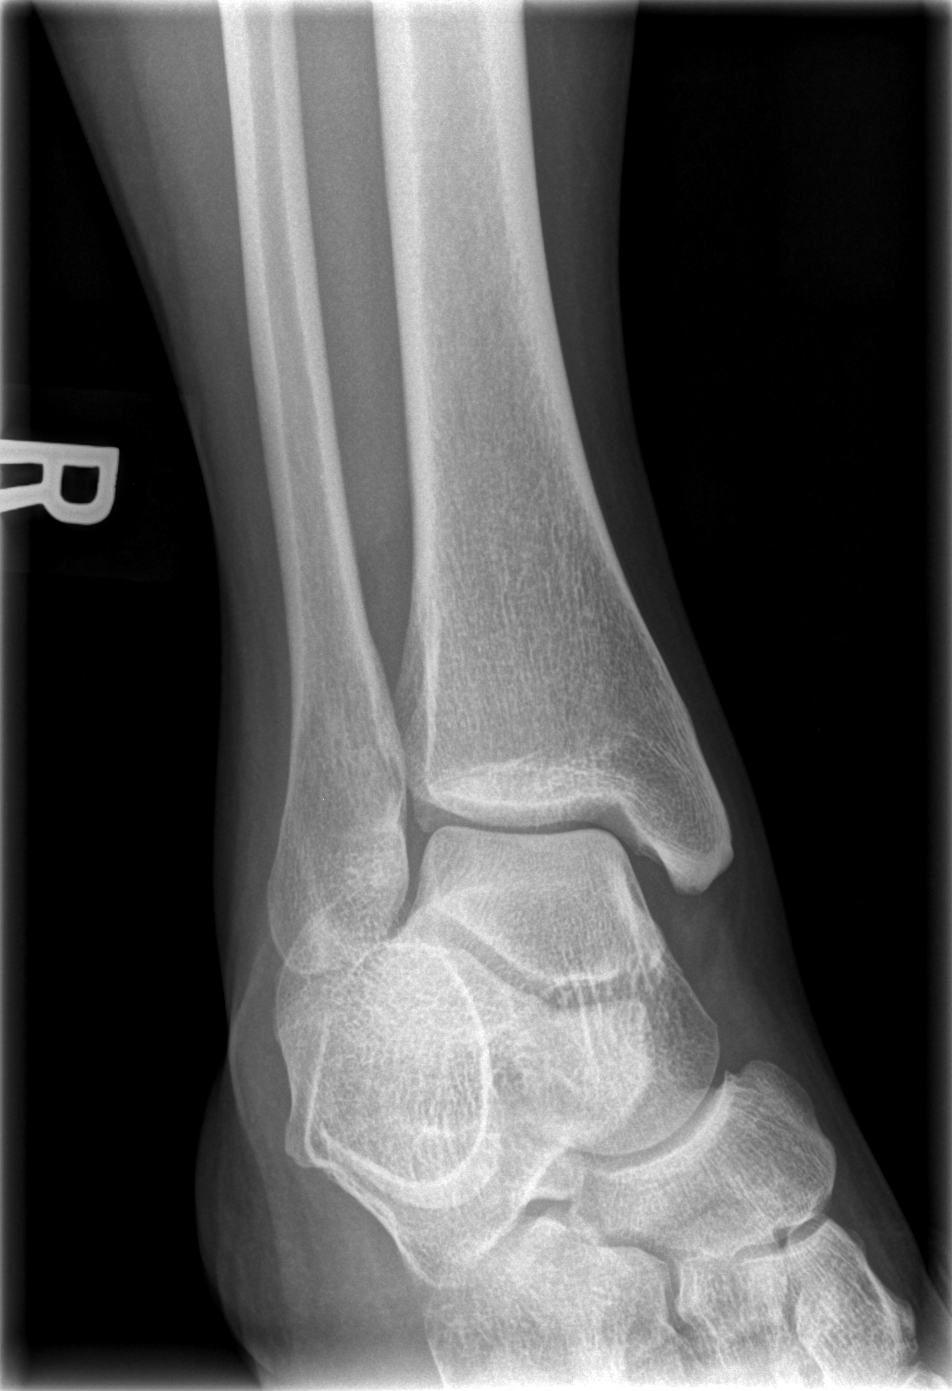

[t ankle joint lat right]
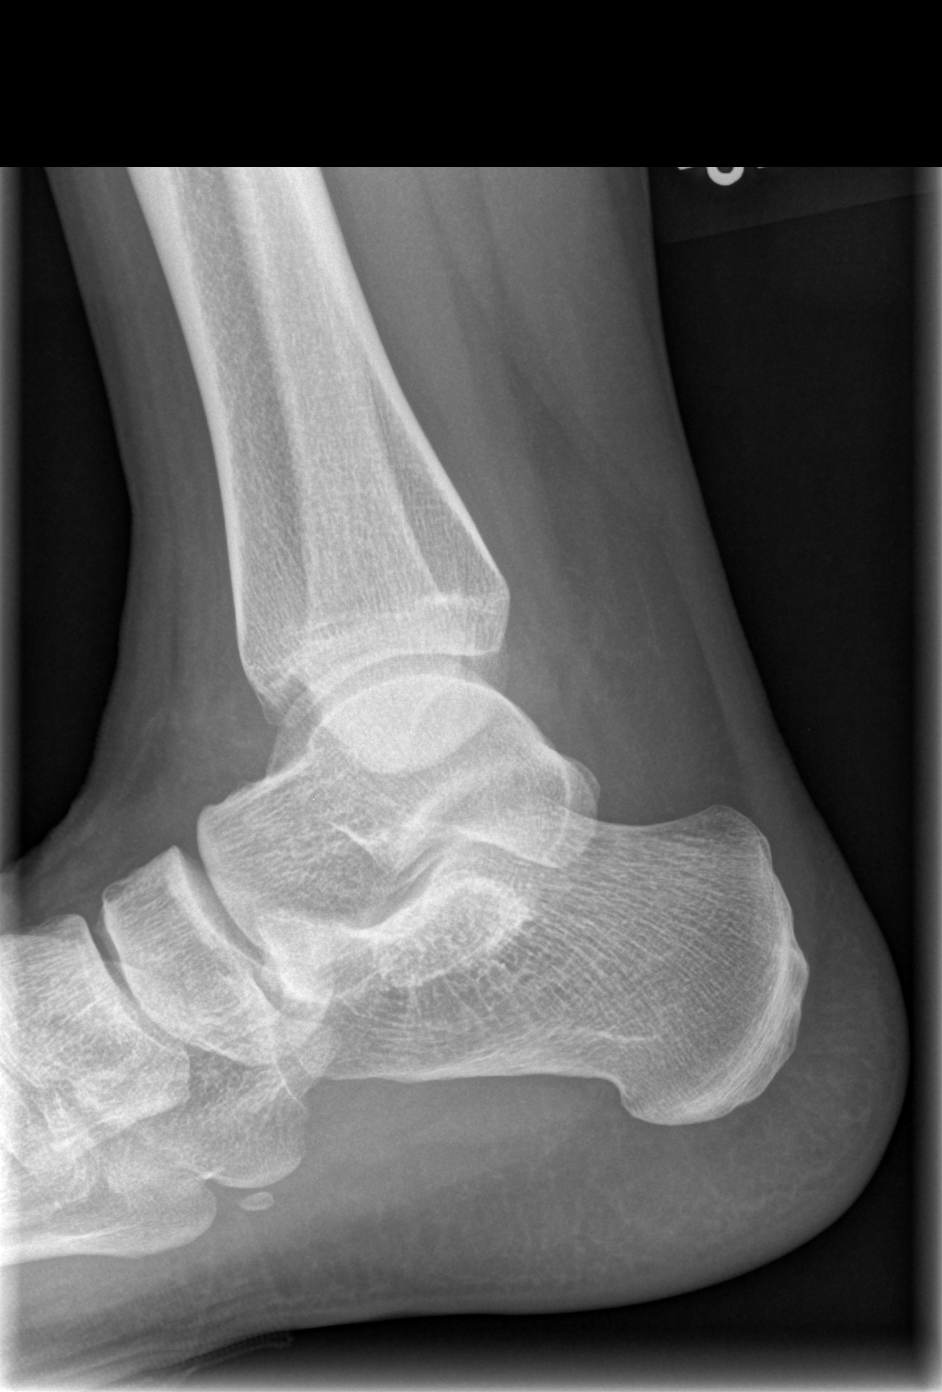

[3 of 3 positions shown; findings below may reference images not displayed]

FINDINGS: There is no evidence of fracture or dislocation. Possible small
tibial talar joint effusion. The ankle mortise is preserved. There
is no evidence of arthropathy or other focal bone abnormality. Mild
lateral tissue edema.
IMPRESSION: Lateral tissue edema and possible small joint effusion. No acute
fracture or dislocation.
